# Patient Record
Sex: Male | Born: 1975 | Race: White | Hispanic: No | Marital: Single | Smoking: Never smoker
Health system: Southern US, Community
[De-identification: ages and names within clinical notes are randomized; demographics above are authoritative.]

---

## 2008-04-06 ENCOUNTER — Emergency Department: Payer: Self-pay | Admitting: Emergency Medicine

## 2012-08-04 ENCOUNTER — Encounter (HOSPITAL_COMMUNITY): Payer: Self-pay | Admitting: Emergency Medicine

## 2012-08-04 ENCOUNTER — Encounter (HOSPITAL_COMMUNITY): Payer: Self-pay | Admitting: *Deleted

## 2012-08-04 ENCOUNTER — Emergency Department (HOSPITAL_COMMUNITY)
Admission: EM | Admit: 2012-08-04 | Discharge: 2012-08-04 | Disposition: A | Discharge status: Home / Self Care | Payer: Managed Care, Other (non HMO) | Attending: Emergency Medicine | Admitting: Emergency Medicine

## 2012-08-04 ENCOUNTER — Emergency Department (INDEPENDENT_AMBULATORY_CARE_PROVIDER_SITE_OTHER): Payer: Managed Care, Other (non HMO)

## 2012-08-04 ENCOUNTER — Emergency Department (INDEPENDENT_AMBULATORY_CARE_PROVIDER_SITE_OTHER)
Admission: EM | Admit: 2012-08-04 | Discharge: 2012-08-04 | Disposition: A | Discharge status: Nursing Home (Includes ICF) | Payer: Managed Care, Other (non HMO) | Source: Home / Self Care

## 2012-08-04 DIAGNOSIS — J029 Acute pharyngitis, unspecified: Secondary | ICD-10-CM | POA: Insufficient documentation

## 2012-08-04 DIAGNOSIS — R6889 Other general symptoms and signs: Secondary | ICD-10-CM

## 2012-08-04 DIAGNOSIS — R0989 Other specified symptoms and signs involving the circulatory and respiratory systems: Secondary | ICD-10-CM

## 2012-08-04 DIAGNOSIS — R198 Other specified symptoms and signs involving the digestive system and abdomen: Secondary | ICD-10-CM

## 2012-08-04 DIAGNOSIS — K228 Other specified diseases of esophagus: Secondary | ICD-10-CM

## 2012-08-04 MED ORDER — GI COCKTAIL ~~LOC~~
30.0000 mL | Freq: Once | ORAL | Status: AC
  Administered 2012-08-04: 30 mL via ORAL
  Filled 2012-08-04: qty 30

## 2012-08-04 NOTE — ED Notes (Signed)
Pt c/o fish bone lodged in pharynx onset this am... Denies: dyspnea. Responding well and oriented w/no signs of acute distress and on phone texting.

## 2012-08-04 NOTE — ED Notes (Signed)
Pt reports having fishbone lodged in throat since this am. Went to ucc and sent here for further exam. They did xray of throat and it was negative. Airway is intact, no distress noted.

## 2012-08-04 NOTE — ED Provider Notes (Signed)
Medical screening examination/treatment/procedure(s) were performed by non-physician practitioner and as supervising physician I was immediately available for consultation/collaboration.  Mileidy Atkin, MD 08/04/12 2053 

## 2012-08-04 NOTE — ED Provider Notes (Signed)
Medical screening examination/treatment/procedure(s) were performed by resident physician or non-physician practitioner and as supervising physician I was immediately available for consultation/collaboration.   KINDL,JAMES DOUGLAS MD.   James D Kindl, MD 08/04/12 2033 

## 2012-08-04 NOTE — ED Provider Notes (Signed)
History     CSN: [id number]  Arrival date & time 08/04/12  1753   First MD Initiated Contact with Patient 08/04/12 1902      Chief Complaint  Patient presents with  . Foreign Body    (Consider location/radiation/quality/duration/timing/severity/associated sxs/prior treatment) HPI Comments: 37 y.o. Male with no significant medical hx presents today complaining of the sensation of a fish bone lodged in the left side of his throat since this morning. Pt denies any airway interference, but does state he has not wanted to eat or drink anything because he "feels it." Endorses moderate amount of pain with swallowing. Pt did seek treatment in an urgent care where pt appeared stable as well. Radiology report showed no radiopaque foreign bodies.   Severity: Moderate  Onset quality: sudden Duration: several Timing: Constant  Progression: Unchanged  Relieved by: Nothing Worsened by: swallowing Ineffective treatments: None tried    Patient is a 37 y.o. male presenting with foreign body.  Foreign Body Associated symptoms: sore throat   Associated symptoms: no drooling, no nausea, no trouble swallowing, no voice change and no vomiting     History reviewed. No pertinent past medical history.  History reviewed. No pertinent past surgical history.  History reviewed. No pertinent family history.  History  Substance Use Topics  . Smoking status: Never Smoker   . Smokeless tobacco: Not on file  . Alcohol Use: Yes      Review of Systems  Constitutional: Negative for fever and diaphoresis.  HENT: Positive for sore throat. Negative for drooling, trouble swallowing, neck pain, neck stiffness and voice change.   Eyes: Negative for visual disturbance.  Respiratory: Negative for apnea, chest tightness and shortness of breath.   Cardiovascular: Negative for chest pain and palpitations.  Gastrointestinal: Negative for nausea, vomiting, diarrhea and constipation.  Genitourinary: Negative for  dysuria.  Musculoskeletal: Negative for gait problem.  Skin: Negative for rash.  Neurological: Negative for dizziness, weakness, light-headedness, numbness and headaches.    Allergies  Review of patient's allergies indicates no known allergies.  Home Medications   Current Outpatient Rx  Name  Route  Sig  Dispense  Refill  . triamcinolone ointment (KENALOG) 0.5 %   Topical   Apply 1 application topically daily as needed. For itching/eczema           BP 133/102  Pulse 71  Temp(Src) 97.9 F (36.6 C) (Oral)  Resp 18  SpO2 95%  Physical Exam  Nursing note and vitals reviewed. Constitutional: He is oriented to person, place, and time. He appears well-developed and well-nourished. No distress.  HENT:  Head: Normocephalic and atraumatic. No trismus in the jaw.  Mouth/Throat: No oropharyngeal exudate, posterior oropharyngeal edema or posterior oropharyngeal erythema.  Eyes: Conjunctivae and EOM are normal.  Neck: Normal range of motion. Neck supple.  No meningeal signs  Cardiovascular: Normal rate, regular rhythm and normal heart sounds.  Exam reveals no gallop and no friction rub.   No murmur heard. Pulmonary/Chest: Effort normal and breath sounds normal. No respiratory distress. He has no wheezes. He has no rales. He exhibits no tenderness.  Abdominal: Soft. Bowel sounds are normal. He exhibits no distension. There is no tenderness. There is no rebound and no guarding.  Musculoskeletal: Normal range of motion. He exhibits no edema and no tenderness.  Neurological: He is alert and oriented to person, place, and time. No cranial nerve deficit.  Skin: Skin is warm and dry. He is not diaphoretic. No erythema.    ED Course  Procedures (including critical care time) Medications  gi cocktail (Maalox,Lidocaine,Donnatal) (30 mLs Oral Given 08/04/12 1951)    Labs Reviewed - No data to display Dg Neck Soft Tissue  08/04/2012   *RADIOLOGY REPORT*  Clinical Data: Possible fish bone.   NECK SOFT TISSUES - 1+ VIEW  Comparison: None  Findings: No visible radiopaque foreign bodies.  Epiglottis and aryepiglottic folds are normal.  Retropharyngeal soft tissues are normal.  Normal bony structures.  Airway is patent.  IMPRESSION: No radiopaque foreign bodies.  Negative.   Original Report Authenticated By: Charlett Nose, M.D.   Filed Vitals:   08/04/12 1930  BP: 133/102  Pulse: 71  Temp:   Resp:       1. Sensation of foreign body in esophagus       MDM  Pt complaining of foreign body sensation. No acute distress. Airway patent. Gave GI cocktail for lidocaine effect which pt states was mildly relieving, but declined prescription for same. Pt able to eat and drink in the ED. Discussed follow up with GI for outpatient endoscopy. No indications for an emergent endoscopy at this time. Discussed pt case with Dr. Judd Lien who is in agreement with plan. Discussed reasons to seek immediate care. Patient expresses understanding and agrees with plan.   Glade Nurse, PA-C 08/04/12 2040

## 2012-08-04 NOTE — ED Provider Notes (Signed)
History     CSN: [id number]  Arrival date & time 08/04/12  1507   None     Chief Complaint  Patient presents with  . Foreign Body    fish bone pharynx    (Consider location/radiation/quality/duration/timing/severity/associated sxs/prior treatment) HPI Comments: 36 year old male was eating fish this morning and or lodged the distal fragment in his oropharynx. He has been there all day long. States it hurts to swallow any foreign body sensation is definitely present. No problems with airway. The coughing, choking or problems with airway.   History reviewed. No pertinent past medical history.  History reviewed. No pertinent past surgical history.  No family history on file.  History  Substance Use Topics  . Smoking status: Never Smoker   . Smokeless tobacco: Not on file  . Alcohol Use: Yes      Review of Systems  HENT: Positive for sore throat. Negative for facial swelling and neck pain.   Gastrointestinal: Negative.   Musculoskeletal: Negative.   All other systems reviewed and are negative.    Allergies  Review of patient's allergies indicates no known allergies.  Home Medications  No current outpatient prescriptions on file.  BP 120/79  Pulse 75  Temp(Src) 98 F (36.7 C) (Oral)  Resp 20  SpO2 98%  Physical Exam  Nursing note and vitals reviewed. Constitutional: He is oriented to person, place, and time. He appears well-developed and well-nourished. No distress.  Eyes: Conjunctivae and EOM are normal.  Neck: Normal range of motion.  Is oropharynx without swelling or erythema or exudates. Unable to directly visualize the lower pharynx.  Cardiovascular: Normal rate.   Pulmonary/Chest: Effort normal. No respiratory distress.  Lymphadenopathy:    He has no cervical adenopathy.  Neurological: He is alert and oriented to person, place, and time.  Skin: Skin is warm and dry. No erythema.  Psychiatric: He has a normal mood and affect.    ED Course   Procedures (including critical care time)  Labs Reviewed - No data to display Dg Neck Soft Tissue  08/04/2012   *RADIOLOGY REPORT*  Clinical Data: Possible fish bone.  NECK SOFT TISSUES - 1+ VIEW  Comparison: None  Findings: No visible radiopaque foreign bodies.  Epiglottis and aryepiglottic folds are normal.  Retropharyngeal soft tissues are normal.  Normal bony structures.  Airway is patent.  IMPRESSION: No radiopaque foreign bodies.  Negative.   Original Report Authenticated By: Charlett Nose, M.D.     1. Sensation of foreign body in throat       MDM  Transfer to the ED for possible FB in the throat. Pt st is a fish bone from this AM. Stable, maintaining airway and secretions. Nl swallow reflex but painful.         Hayden Rasmussen, NP 08/04/12 1739

## 2014-08-19 ENCOUNTER — Ambulatory Visit: Payer: Self-pay

## 2014-08-19 ENCOUNTER — Other Ambulatory Visit: Payer: Self-pay | Admitting: Occupational Medicine

## 2014-08-19 DIAGNOSIS — Z0000 Encounter for general adult medical examination without abnormal findings: Secondary | ICD-10-CM

## 2016-12-26 IMAGING — CR DG CHEST 2V
2 series · 2 of 2 positions shown · non-contrast
Comparison: None.

CLINICAL DATA: Tobacco use.

EXAM:
CHEST  2 VIEW

[view not recorded (1 of 2)]
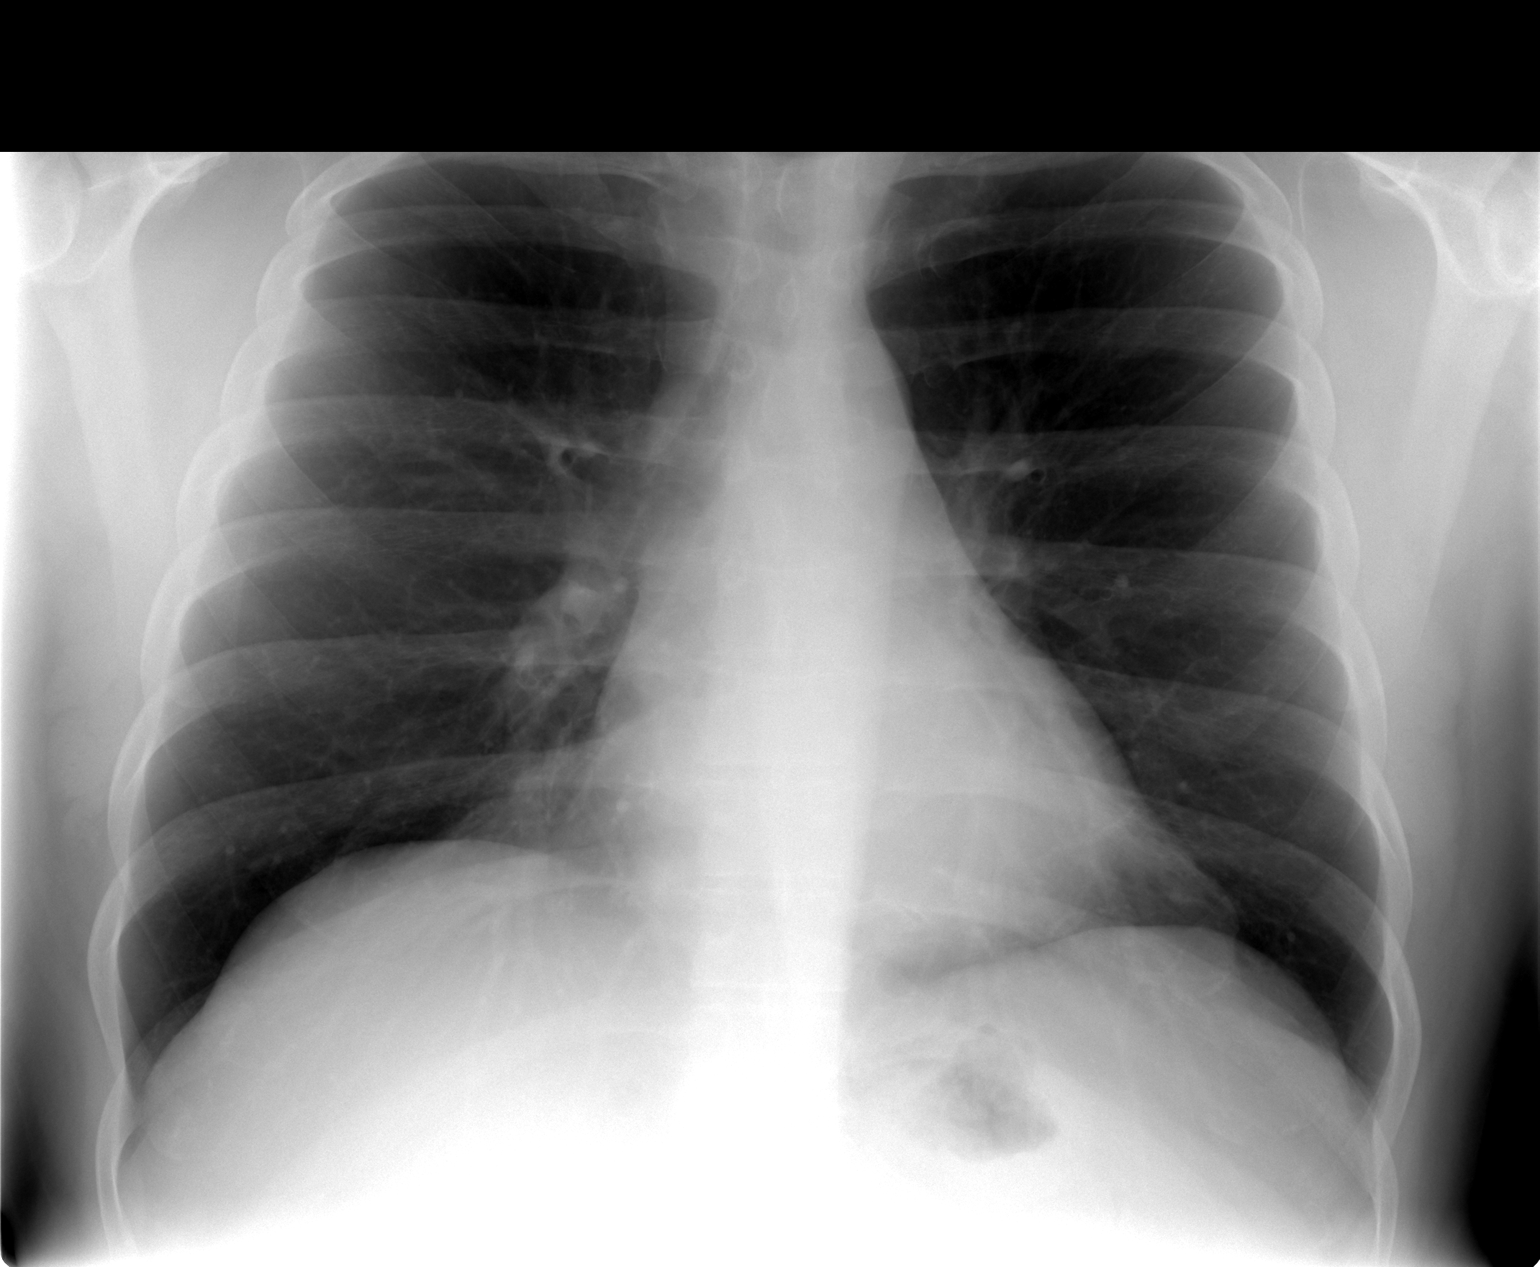

[view not recorded (2 of 2)]
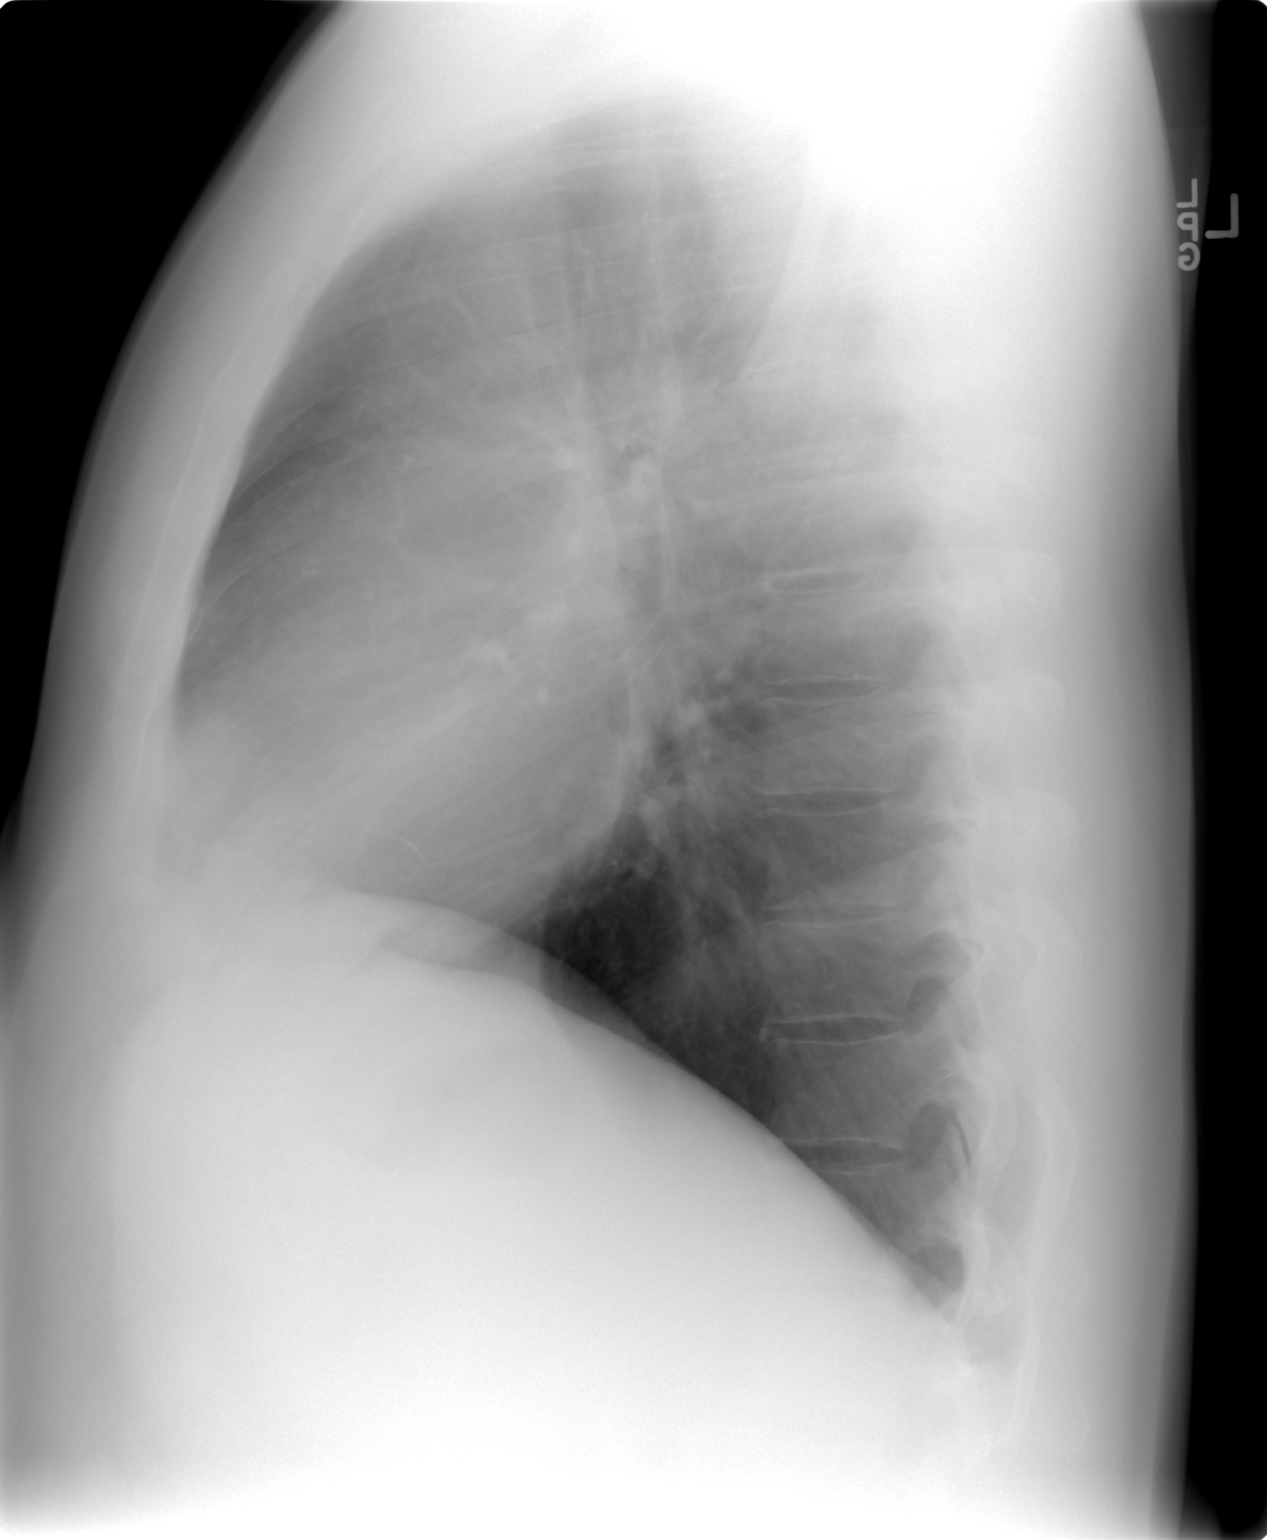

[2 of 2 positions shown; findings below may reference images not displayed]

FINDINGS: Lungs are clear. Heart size and pulmonary vascularity are normal. No
adenopathy. No bone lesions.
IMPRESSION: No abnormality noted.

## 2019-12-17 ENCOUNTER — Other Ambulatory Visit: Payer: Self-pay | Admitting: Internal Medicine

## 2019-12-17 DIAGNOSIS — M5416 Radiculopathy, lumbar region: Secondary | ICD-10-CM

## 2022-03-25 ENCOUNTER — Emergency Department
Admission: EM | Admit: 2022-03-25 | Discharge: 2022-03-25 | Disposition: A | Discharge status: Home / Self Care | Payer: Commercial Managed Care - PPO | Attending: Emergency Medicine | Admitting: Emergency Medicine

## 2022-03-25 DIAGNOSIS — S65500A Unspecified injury of blood vessel of right index finger, initial encounter: Secondary | ICD-10-CM | POA: Diagnosis present

## 2022-03-25 DIAGNOSIS — S61210A Laceration without foreign body of right index finger without damage to nail, initial encounter: Secondary | ICD-10-CM

## 2022-03-25 DIAGNOSIS — W268XXA Contact with other sharp object(s), not elsewhere classified, initial encounter: Secondary | ICD-10-CM | POA: Diagnosis not present

## 2022-03-25 MED ORDER — CEPHALEXIN 500 MG PO CAPS: 1000.0000 mg | ORAL_CAPSULE | Freq: Two times a day (BID) | ORAL | 0 refills | Status: AC

## 2022-03-25 MED ORDER — LIDOCAINE-EPINEPHRINE (PF) 2 %-1:200000 IJ SOLN
10.0000 mL | Freq: Once | INTRAMUSCULAR | Status: AC
  Administered 2022-03-25: 10 mL
  Filled 2022-03-25: qty 20

## 2022-03-25 NOTE — ED Triage Notes (Signed)
Pt sts that he was opening a soup can and cut his second finger on the right hand. Bleeding controlled at this time.

## 2022-03-25 NOTE — Discharge Instructions (Addendum)
-  The sutures will need to be removed in approximately 10 days.  You may go to your primary care provider, urgent care, minute clinic, or return here to the emergency department for this.  -You may continue to cleanse the wound with soap and water daily.  You may apply topical antibiotic ointment, such as Neosporin as well.  Please take the full course of the antibiotics to prevent infection.  -Return to the emergency department anytime if you begin to experience any new or worsening symptoms.

## 2022-03-25 NOTE — ED Provider Notes (Signed)
[facility] Provider Note    Event Date/Time   First MD Initiated Contact with Patient 03/25/22 1959     (approximate)   History   Chief Complaint Laceration   HPI Eddie Wall is a 47 y.o. male, no significant medical history, presents to the emergency department for evaluation of laceration to the second finger on his right hand.  He states that he was opening a can of soup when the lid cut his fingers.  Denies any other injuries.  He is up-to-date on his tetanus.  Denies decreased range of motion, paresthesias, cold sensation, fever/chills, nausea/vomiting, or dizziness/lightheadedness.  History Limitations: No limitations.        Physical Exam  Triage Vital Signs: ED Triage Vitals  Enc Vitals Group     BP 03/25/22 1911 (!) 129/105     Pulse Rate 03/25/22 1911 80     Resp 03/25/22 1911 18     Temp 03/25/22 1911 98.7 F (37.1 C)     Temp Source 03/25/22 1911 Oral     SpO2 03/25/22 1911 99 %     Weight 03/25/22 1912 240 lb (108.9 kg)     Height 03/25/22 1912 6\' 1"  (1.854 m)     Head Circumference --      Peak Flow --      Pain Score 03/25/22 1920 0     Pain Loc --      Pain Edu? --      Excl. in [place]? --     Most recent vital signs: Vitals:   03/25/22 1911 03/25/22 1926  BP: (!) 129/105 (!) 133/95  Pulse: 80   Resp: 18   Temp: 98.7 F (37.1 C)   SpO2: 99%     General: Awake, NAD.  Skin: Warm, dry. No rashes or lesions.  Eyes: PERRL. Conjunctivae normal.  CV: Good peripheral perfusion.  Resp: Normal effort.  Abd: Soft, non-tender. No distention.  Neuro: At baseline. No gross neurological deficits.  Musculoskeletal: Normal ROM of all extremities.  Focused Exam: 1.5 cm curved laceration along the palmar aspect of the distal tip of the index finger.  No foreign bodies.  No active bleeding or discharge.  Normal range of motion of the digit, including full flexion and extension at the DIP and PIP joint.  No involvement of the nailbed.   Superficial lacerations along the palmar aspects of the third and fourth digits as well, though seem very mild.  They do not break the dermis.  Physical Exam    ED Results / Procedures / Treatments  Labs (all labs ordered are listed, but only abnormal results are displayed) Labs Reviewed - No data to display   EKG N/A.    RADIOLOGY  ED Provider Interpretation: N/A.  No results found.  PROCEDURES:  Critical Care performed: N/A.  Marland Kitchen.Laceration Repair  Date/Time: 03/25/2022 8:46 PM  Performed by: Teodoro Spray, PA Authorized by: Teodoro Spray, PA   Consent:    Consent obtained:  Verbal   Consent given by:  Patient   Risks, benefits, and alternatives were discussed: yes     Risks discussed:  Infection, need for additional repair, nerve damage, poor wound healing, poor cosmetic result, pain, retained foreign body, tendon damage and vascular damage   Alternatives discussed:  No treatment Universal protocol:    Patient identity confirmed:  Verbally with patient Anesthesia:    Anesthesia method:  Nerve block   Block location:  Ring block; index finger  Block needle gauge:  27 G   Block anesthetic:  Lidocaine 2% WITH epi   Block technique:  Ring block   Block injection procedure:  Anatomic landmarks identified   Block outcome:  Anesthesia achieved Laceration details:    Location:  Finger   Finger location:  R index finger   Length (cm):  1.5   Depth (mm):  2 Pre-procedure details:    Preparation:  Patient was prepped and draped in usual sterile fashion Exploration:    Hemostasis achieved with:  Direct pressure   Wound extent: fascia not violated, no foreign body, no signs of injury, no underlying fracture and no vascular damage     Contaminated: no   Treatment:    Area cleansed with:  Soap and water   Amount of cleaning:  Extensive   Irrigation solution:  Tap water   Irrigation volume:  3000 ml   Irrigation method:  Pressure wash   Debridement:   None   Undermining:  None Skin repair:    Repair method:  Sutures   Suture size:  6-0   Suture material:  Nylon   Suture technique:  Simple interrupted   Number of sutures:  6 Approximation:    Approximation:  Close Repair type:    Repair type:  Simple Post-procedure details:    Dressing:  Adhesive bandage   Procedure completion:  Tolerated well, no immediate complications     MEDICATIONS ORDERED IN ED: Medications  lidocaine-EPINEPHrine (XYLOCAINE W/EPI) 2 %-1:200000 (PF) injection 10 mL (10 mLs Infiltration Given 03/25/22 2016)     IMPRESSION / MDM / [place] / ED COURSE  I reviewed the triage vital signs and the nursing notes.                              Differential diagnosis includes, but is not limited to, finger laceration, no plate injury, foreign body, flexor tendon injury, cellulitis.  Assessment/Plan Patient presents with 1.5 cm curved laceration along the palmar aspect of the distal tip of the right index finger.  No signs of flexor tendon involvement.  Sensation intact.  No evidence of neurovascular injuries.  No foreign bodies.  I was able to successfully repair utilizing simple interrupted sutures.  Patient tolerated seizure well.  See above for details.  Will place him on cephalexin for infection prevention.  Advised him to return for suture removal in 10 days.  He was amenable to this.  Will discharge.  Provided the patient with anticipatory guidance, return precautions, and educational material. Encouraged the patient to return to the emergency department at any time if they begin to experience any new or worsening symptoms. Patient expressed understanding and agreed with the plan.   Patient's presentation is most consistent with acute complicated illness / injury requiring diagnostic workup.       FINAL CLINICAL IMPRESSION(S) / ED DIAGNOSES   Final diagnoses:  Laceration of right index finger without foreign body without damage to nail,  initial encounter     Rx / DC Orders   ED Discharge Orders          Ordered    cephALEXin (KEFLEX) 500 MG capsule  2 times daily        03/25/22 2045             Note:  This document was prepared using Dragon voice recognition software and may include unintentional dictation errors.   Teodoro Spray, PA  03/25/22 2048    Lucillie Garfinkel, MD 03/25/22 2342

## 2023-09-13 ENCOUNTER — Other Ambulatory Visit: Payer: Self-pay

## 2023-09-13 ENCOUNTER — Ambulatory Visit: Admission: EM | Admit: 2023-09-13 | Discharge: 2023-09-13 | Disposition: A | Discharge status: Home / Self Care

## 2023-09-13 ENCOUNTER — Encounter: Payer: Self-pay | Admitting: Emergency Medicine

## 2023-09-13 DIAGNOSIS — L723 Sebaceous cyst: Secondary | ICD-10-CM

## 2023-09-13 DIAGNOSIS — L089 Local infection of the skin and subcutaneous tissue, unspecified: Secondary | ICD-10-CM

## 2023-09-13 MED ORDER — CLINDAMYCIN HCL 300 MG PO CAPS: 300.0000 mg | ORAL_CAPSULE | Freq: Three times a day (TID) | ORAL | 0 refills | Status: AC

## 2023-09-13 NOTE — ED Triage Notes (Signed)
 Pt has abscess or possible insect bite on his abdomen. He was seen at another UC 4 days ago. He was given bactrim but does not seem to be improving. Ara is still, red and pain.

## 2023-09-13 NOTE — ED Provider Notes (Addendum)
 [facility]    CSN: [id number] Arrival date & time: 09/13/23  0830      History   Chief Complaint Chief Complaint  Patient presents with   Insect Bite   Abscess    HPI OBERT ESPINDOLA is a 48 y.o. male.   HPI  48 year old male with no significant past medical history presents for evaluation of a possible abscess on his abdomen.  He reports that the abscess began as what he thought was an infected hair bump 1 week ago.  He drove to [place]  and then on the return trip the seatbelt was rubbing his his abdomen and caused increased pain.  He went to next care in [place] 4 days ago and was diagnosed with a sebaceous cyst and started on Bactrim.  He reports that the area has not improved.  This morning it began draining a bloody pus.  He denies fevers.  History reviewed. No pertinent past medical history.  There are no active problems to display for this patient.   History reviewed. No pertinent surgical history.     Home Medications    Prior to Admission medications   Medication Sig Start Date End Date Taking? Authorizing Provider  clindamycin (CLEOCIN) 300 MG capsule Take 1 capsule (300 mg total) by mouth 3 (three) times daily for 7 days. 09/13/23 09/20/23 Yes Bernardino Ditch, NP  sulfamethoxazole-trimethoprim (BACTRIM DS) 800-160 MG tablet SMARTSIG:1 Tablet(s) By Mouth Every 12 Hours 09/09/23  Yes [provider]  triamcinolone ointment (KENALOG) 0.5 % Apply 1 application topically daily as needed. For itching/eczema    [provider]    Family History History reviewed. No pertinent family history.  Social History Social History   Tobacco Use   Smoking status: Never  Vaping Use   Vaping status: Never Used  Substance Use Topics   Alcohol use: Yes   Drug use: No     Allergies   Patient has no known allergies.   Review of Systems Review of Systems  Constitutional:  Negative for fever.  Skin:  Positive for color change and wound.      Physical Exam Triage Vital Signs ED Triage Vitals  Encounter Vitals Group     BP      Girls Systolic BP Percentile      Girls Diastolic BP Percentile      Boys Systolic BP Percentile      Boys Diastolic BP Percentile      Pulse      Resp      Temp      Temp src      SpO2      Weight      Height      Head Circumference      Peak Flow      Pain Score      Pain Loc      Pain Education      Exclude from Growth Chart    No data found.  Updated Vital Signs BP 125/83 (BP Location: Right Arm)   Pulse 73   Temp 97.9 F (36.6 C) (Oral)   Resp 16   Ht 6' 1 (1.854 m)   Wt 240 lb 1.3 oz (108.9 kg)   SpO2 94%   BMI 31.67 kg/m   Visual Acuity Right Eye Distance:   Left Eye Distance:   Bilateral Distance:    Right Eye Near:   Left Eye Near:    Bilateral Near:     Physical Exam  Vitals and nursing note reviewed.  Constitutional:      Appearance: Normal appearance. He is not ill-appearing.  HENT:     Head: Normocephalic and atraumatic.  Skin:    General: Skin is warm and dry.     Capillary Refill: Capillary refill takes less than 2 seconds.     Findings: Erythema and lesion present.  Neurological:     General: No focal deficit present.     Mental Status: He is alert and oriented to person, place, and time.      UC Treatments / Results  Labs (all labs ordered are listed, but only abnormal results are displayed) Labs Reviewed - No data to display  EKG   Radiology No results found.  Procedures Procedures (including critical care time)  Medications Ordered in UC Medications - No data to display  Initial Impression / Assessment and Plan / UC Course  I have reviewed the triage vital signs and the nursing notes.  Pertinent labs & imaging results that were available during my care of the patient were reviewed by me and considered in my medical decision making (see chart for details).   Patient is a pleasant 48 year old male presenting for evaluation  of an abdominal abscess that started 1 week ago.  As you can see in image above, there is an oval area of deep erythema with an open tract in the center and bloody pus exuding from the tract.  There is extending erythema on both sides.  The area of erythema measures 22 cm x 5 cm.  The central area is fluctuant.  I have advised the patient that we do need to perform an incision and drainage to open up and help drain the infection.  He gave verbal consent.  The area was cleansed with alcohol, anesthetized with 5 mL of 1% lidocaine  with epi, and once good anesthesia was achieved the area was cleansed with chlorhexidine and saline and a horizontal incision was made using a #11 blade.  There was a scant amount of serosanguineous pus followed by approximately 3 mL of thick curd-like pus.  I deloculated the wound using cotton-tipped applicator.  No packing applied as the surgical channel is narrow.  I suspect that this is an infected sebaceous cyst.  The discoloration continues on the skin and it may be secondary to bruising as I do not appreciate tissue necrosis.  The wound was dressed with a nonstick dressing and Mefix tape.  I will have the patient finish up his Bactrim and add clindamycin 300 mg 3 times daily x 7 days.  Additionally, I will refer the patient to general surgery for evaluation as if the wound does not improve it may need to be surgically excised.   Final Clinical Impressions(s) / UC Diagnoses   Final diagnoses:  Infected sebaceous cyst of skin     Discharge Instructions      Okay to finish up the Bactrim that you were previously prescribed on going to add on clindamycin to your drug regimen.  You will take 300 mg 3 times a day with food for a week.  Apply warm compresses to your abdomen to help facilitate drainage.  Change your dressing twice daily, or more frequently if the drainage increases.  I have referred you to general surgery for evaluation.  I would like them to monitor the  healing of the wound.  If your wound does not heal you may need to have surgery to remove the sac.  If you develop  increased redness, swelling, significant pus drainage, or you start running a fever you need to either return for reevaluation or seek care in the emergency department.     ED Prescriptions     Medication Sig Dispense Auth. Provider   clindamycin (CLEOCIN) 300 MG capsule Take 1 capsule (300 mg total) by mouth 3 (three) times daily for 7 days. 21 capsule Bernardino Ditch, NP      PDMP not reviewed this encounter.   Bernardino Ditch, NP 09/13/23 9083    Bernardino Ditch, NP 09/13/23 [id number]

## 2023-09-13 NOTE — Discharge Instructions (Signed)
 Okay to finish up the Bactrim that you were previously prescribed on going to add on clindamycin to your drug regimen.  You will take 300 mg 3 times a day with food for a week.  Apply warm compresses to your abdomen to help facilitate drainage.  Change your dressing twice daily, or more frequently if the drainage increases.  I have referred you to general surgery for evaluation.  I would like them to monitor the healing of the wound.  If your wound does not heal you may need to have surgery to remove the sac.  If you develop increased redness, swelling, significant pus drainage, or you start running a fever you need to either return for reevaluation or seek care in the emergency department.

## 2023-09-20 ENCOUNTER — Ambulatory Visit: Payer: Self-pay | Admitting: Surgery

## 2023-09-25 ENCOUNTER — Ambulatory Visit (INDEPENDENT_AMBULATORY_CARE_PROVIDER_SITE_OTHER): Payer: Self-pay | Admitting: Surgery

## 2023-09-25 ENCOUNTER — Encounter: Payer: Self-pay | Admitting: Surgery

## 2023-09-25 VITALS — BP 113/72 | HR 75 | Ht 73.0 in | Wt 245.0 lb

## 2023-09-25 DIAGNOSIS — M6208 Separation of muscle (nontraumatic), other site: Secondary | ICD-10-CM | POA: Diagnosis not present

## 2023-09-25 DIAGNOSIS — L02211 Cutaneous abscess of abdominal wall: Secondary | ICD-10-CM

## 2023-09-25 DIAGNOSIS — K429 Umbilical hernia without obstruction or gangrene: Secondary | ICD-10-CM | POA: Diagnosis not present

## 2023-09-25 NOTE — Progress Notes (Signed)
 09/25/2023  Reason for Visit:  Abdominal wall abscess  History of Present Illness: Eddie Wall is a 48 y.o. male presenting for evaluation of abdominal wall abscess.  The patient presented to [facility] on 09/09/23 with a possibly infected ingrown hair vs spider bite.  He was given a course of Bactrim, but this did not help and then presented to Urgent Care on 09/13/23.  At that time, he had I&D of the area and was given course of Clindamycin .  He reports that he's feeling much better after I&D.  The wound had healed and has a small scab.  Denies any further drainage, and the redness has improved to resolved.  Denies any fevers, chills.  Separately, he also reports having an umbilical hernia that he feels was causing more pain while the area of the abdominal wall was infected.  Past Medical History: No past medical history on file.   Past Surgical History: No past surgical history on file.  Home Medications: Prior to Admission medications   Medication Sig Start Date End Date Taking? Authorizing Provider  triamcinolone ointment (KENALOG) 0.5 % Apply 1 application topically daily as needed. For itching/eczema   Yes [provider]    Allergies: No Known Allergies  Social History:  reports that he has never smoked. He has never been exposed to tobacco smoke. He has never used smokeless tobacco. He reports current alcohol use. He reports that he does not use drugs.   Family History: No family history on file.  Review of Systems: Review of Systems  Constitutional:  Negative for chills and fever.  Respiratory:  Negative for shortness of breath.   Cardiovascular:  Negative for chest pain.  Gastrointestinal:  Negative for nausea and vomiting.  Skin:        Healing lower abdominal wall abscess.    Physical Exam BP 113/72   Pulse 75   Ht 6' 1 (1.854 m)   Wt 245 lb (111.1 kg)   SpO2 98%   BMI 32.32 kg/m  CONSTITUTIONAL: No acute distress HEENT:  Normocephalic,  atraumatic, extraocular motion intact. RESPIRATORY:  Normal respiratory effort without pathologic use of accessory muscles. CARDIOVASCULAR: Regular rhythm and rate. GI: The abdomen is soft, non-distended, non-tender.  The patient has a healing wound in the lower abdominal wall at the midline, with scab that's healed.  There's residual firmness palpable that is about 1.5 cm in size.  Surrounding skin has some peeling, but no significant erythema or induration.  The patient also has a reducible umbilical hernia, about 1 cm in size, with an associated upper abdominal diastasis recti of about 3.5 cm separation.  MUSCULOSKELETAL:  Normal muscle strength and tone in all four extremities.  No peripheral edema or cyanosis. NEUROLOGIC:  Motor and sensation is grossly normal.  Cranial nerves are grossly intact. PSYCH:  Alert and oriented to person, place and time. Affect is normal.  Laboratory Analysis: No results found for this or any previous visit (from the past 24 hours).  Imaging: No results found.  Assessment and Plan: This is a 48 y.o. male with an abdominal wall abscess  --The patient's wound is healed and the inflammation/erythema subsiding.  Discussed with him that if this was a cyst, there would still be remnant after I&D which could get infected again in the future.  If this was a spider bite, then simple I&D would treat and likely nothing further would be needed.  Discussed with him the option for office excision of the prior infected  area to prevent further recurrence, but there is no specific urgency or pressure to do this.  Another option would be for watchful waiting and if any recurrence, we could proceed with excision.  He is in agreement with this plan. --With regards to the umbilical hernia, the area of abscess was only a few cm away from it, so likely the expanding erythema and inflammation led to a reactive discomfort.  His hernia is reducible and has not caused him any discomfort in  the past.  He also has diastasis recti.  I discussed with the patient how diastasis can form and how umbilical hernias can form.  Discussed that diastasis is not a hernia but if we repair the umbilical hernia we could also plicate the diastasis.  Discussed that weight loss may help with the diastasis, but unfortunately there is no guarantee that the muscles/fascia recoil.  He will try to lose weight first and then he will call us  to set up hernia repair. --Follow up in the future as needed.  I spent 30 minutes dedicated to the care of this patient on the date of this encounter to include pre-visit review of records, face-to-face time with the patient discussing diagnosis and management, and any post-visit coordination of care.   Aloysius Sheree Plant, MD [facility]

## 2023-09-25 NOTE — Patient Instructions (Addendum)
 Do keep an eye on the area and let us  know if this area starts to get infected again. We may need to see you to examine the area and start an antibiotic or drain the area.   That will let us  know that you still have some cyst wall left inside.   Follow-up with our office as needed.  Please call and ask to speak with a nurse if you develop questions or concerns.    Pocket of Fluid in the Skin (Epidermoid Cyst): What to Know  An epidermoid cyst is a pocket of fluid that can form under your skin. It's filled with thick, oily substance that your skin glands make. These cysts occur anywhere on your body. They're usually harmless and don't cause problems unless they get inflamed or infected. What are the causes? An epidermoid cyst may be caused by: A blocked pore or hair follicle. An ingrown hair. This is a hair that curls and re-enters the skin instead of growing straight out of the skin. Skin irritation. Skin injuries. Some conditions that are passed from parent to child (inherited). Human papillomavirus (HPV). This is rare but can cause cysts on the bottom of the feet. Long-term (chronic) sun damage to the skin. What increases the risk? Having acne. Being male. Skin injuries. Being past puberty. Certain rare genetic disorders. What are the signs or symptoms? The only sign of this type of cyst may be a small, painless lump under the skin. When an epidermal cyst ruptures, it may become inflamed. Infections are rare, but symptoms may include: Redness. Inflammation. Tenderness. Warmth. Fever. A bad-smelling, grayish-white substance draining from the cyst. Pus draining from the cyst. How is this diagnosed? This condition is diagnosed with a physical exam. Sometimes, a tissue sample (biopsy) may need to be looked at under a microscope or tested for bacteria. You may be referred to a health care provider who specializes in skin care. This provider is called a dermatologist. How is this  treated? If a cyst becomes inflamed, treatment may include: Opening and draining the cyst. Antibiotics. Steroid shots to lessen inflammation. Surgery to take out cysts that are large, painful, or could turn into cancer. Do not try to open or squeeze a cyst yourself. Follow these instructions at home: Medicines Take your medicines only as told. If you were given antibiotics, take them as told. Do not stop taking them even if you start to feel better. General instructions Keep the area around your cyst clean and dry. Wear loose, dry clothing. Avoid touching your cyst. Check the area around your cyst every day for signs of infection. Check for: Redness, swelling, or pain. Fluid or blood. Warmth. Pus or a bad smell. Keep all follow-up visits to make sure the cyst isn't becoming uncomfortable or infected. Contact a health care provider if: You have any signs of infection. Your cyst doesn't get better or gets worse. You get a cyst that looks different from other cysts you've had. You have a fever. You have redness that spreads from the cyst.  Umbilical Hernia, Adult  A hernia is a lump of tissue that pushes through an opening in the muscles. An umbilical hernia happens in the belly, near the belly button. The hernia may contain tissues from the small or large intestine. It may also have fatty tissue that covers the intestines. Umbilical hernias in adults may get worse over time. They need to be treated with surgery. There are several types of umbilical hernias. They include: Indirect hernia.  This occurs just above or below the belly button. It's the most common type of umbilical hernia in adults. Direct hernia. This type occurs in an opening that's formed by the belly button. Reducible hernia. This hernia comes and goes. You may see it only when you strain, cough, or lift something heavy. This type of hernia can be pushed back into the belly (reduced). Incarcerated hernia. This traps  the hernia in the wall of the belly. This type of hernia can't be pushed back into the belly. It can cause a strangulated hernia. Strangulated hernia. This hernia cuts off blood flow to the tissues inside the hernia. The tissues can die if this happens. This type of hernia must be treated right away. What are the causes? An umbilical hernia happens when tissue inside the belly pushes through an opening in the muscles of the belly. What increases the risk? You're more likely to get this hernia if: You strain while lifting or pushing heavy objects. You've had several pregnancies. You have a condition that puts pressure on your belly, and you've had it for a long time. These include: Obesity. A buildup of fluid inside your belly. Vomiting or coughing all the time. Trouble pooping (constipation). You've had surgery that weakened the muscles in the belly. What are the signs or symptoms? The main symptom of this condition is a bulge at the belly button or near it. The bulge does not cause pain. Other symptoms depend on the type of hernia you have. A reducible hernia may be seen only when you strain, cough, or lift something heavy. Other symptoms may include: Dull pain. A feeling of pressure. An incarcerated hernia may cause very bad pain. Also, you may: Vomit or feel like you may vomit. Not be able to pass gas. A strangulated hernia may cause: Pain that gets worse and worse. Vomiting, or feeling like you may vomit. Pain when you press on the hernia. Change of color on the skin over the hernia. The skin may become red or purple. Trouble pooping. Blood in the poop. How is this diagnosed? This condition may be diagnosed based on: Your symptoms and medical history. A physical exam. You may be asked to cough or strain while standing. These actions will put pressure inside your belly. The pressure can force the hernia through the opening in your muscles. Your health care provider may try to  push the hernia back into your belly (reduce). How is this treated? Surgery is the only treatment for an umbilical hernia. Surgery for a strangulated hernia must be done right away. If you have a small hernia that's not incarcerated, you may need to lose weight before the surgery is done. Follow these instructions at home: Managing constipation You may need to take these actions to prevent trouble pooping. This will help to prevent straining. Drink enough fluid to keep your pee (urine) pale yellow. Take over-the-counter or prescription medicines. Eat foods that are high in fiber, such as beans, whole grains, and fresh fruits and vegetables. Limit foods that are high in fat and sugars, such as fried or sweet foods. General instructions Do not try to push the hernia back in. Lose weight, if told by your provider. Watch your hernia for any changes in color or size. Tell your provider if any changes occur. You may need to avoid activities that put pressure on your hernia. You may have to avoid lifting. Ask your provider how much you can safely lift. Take over-the-counter and prescription medicines only  as told by your provider. Contact a health care provider if: Your hernia gets larger or feels hard. Your hernia becomes painful. You get a fever or chills. Get help right away if: You get very bad pain near the area of the hernia, and the pain comes on suddenly. You have pain and you vomit or feel like you may vomit. The skin over your hernia changes color. These symptoms may be an emergency. Get help right away. Call 911. Do not wait to see if the symptoms go away. Do not drive yourself to the hospital. This information is not intended to replace advice given to you by your health care provider. Make sure you discuss any questions you have with your health care provider. Document Revised: 05/29/2022 Document Reviewed: 05/29/2022 Elsevier Patient Education  2024 [employer].
